# Patient Record
Sex: Female | Born: 2005 | Race: White | Hispanic: No | Marital: Single | Smoking: Never smoker
Health system: Southern US, Community
[De-identification: ages and names within clinical notes are randomized; demographics above are authoritative.]

## PROBLEM LIST (undated history)

## (undated) HISTORY — PX: TONSILLECTOMY: SUR1361

## (undated) HISTORY — PX: ADENOIDECTOMY: SUR15

## (undated) HISTORY — PX: TYMPANOSTOMY TUBE PLACEMENT: SHX32

---
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
## 2023-09-02 ENCOUNTER — Ambulatory Visit (INDEPENDENT_AMBULATORY_CARE_PROVIDER_SITE_OTHER): Payer: Commercial Managed Care - PPO | Admitting: Orthopaedic Surgery

## 2023-09-02 ENCOUNTER — Other Ambulatory Visit (INDEPENDENT_AMBULATORY_CARE_PROVIDER_SITE_OTHER): Payer: Commercial Managed Care - PPO

## 2023-09-02 ENCOUNTER — Encounter: Payer: Self-pay | Admitting: Orthopaedic Surgery

## 2023-09-02 VITALS — BP 120/80 | HR 73 | Ht 64.0 in | Wt 212.0 lb

## 2023-09-02 DIAGNOSIS — M25572 Pain in left ankle and joints of left foot: Secondary | ICD-10-CM | POA: Diagnosis not present

## 2023-09-02 MED ORDER — PREDNISONE 5 MG (21) PO TBPK: ORAL_TABLET | ORAL | 0 refills | Status: DC

## 2023-09-02 NOTE — Progress Notes (Signed)
 Subjective:    Patient ID: Kaylee Warren, female    DOB: 07/17/2005, 18 y.o.   MRN: [id number]  HPI She has had left ankle pain since October.  She dances ballet and has pain that has increased over the last few months.  She has pain when pointing and now when walking short distances. She has no redness, no swelling, no numbness. She has taken 800 mgm ibuprofen three to four times a day for over two weeks with no help. She has seen the PA at the medical clinic at her college in November.  She has been wearing high top tennis shoes and they have not helped.  She is tired of hurting.   Review of Systems  Constitutional:  Positive for activity change.  Musculoskeletal:  Positive for arthralgias and gait problem.  All other systems reviewed and are negative. For Review of Systems, all other systems reviewed and are negative.  The following is a summary of the past history medically, past history surgically, known current medicines, social history and family history.  This information is gathered electronically by the computer from prior information and documentation.  I review this each visit and have found including this information at this point in the chart is beneficial and informative.   No past medical history on file.  Past Surgical History:  Procedure Laterality Date   TONSILLECTOMY     TYMPANOSTOMY TUBE PLACEMENT      Current Outpatient Medications on File Prior to Visit  Medication Sig Dispense Refill   cetirizine-pseudoephedrine (ZYRTEC-D) 5-120 MG tablet Take 1 tablet by mouth daily. 30 tablet 0   fluticasone (FLONASE) 50 MCG/ACT nasal spray Place 1 spray into both nostrils daily. 16 g 0   magic mouthwash w/lidocaine SOLN Take 5 mLs by mouth 3 (three) times daily as needed for mouth pain. Swish and spit, do not swallow 100 mL 0   promethazine-dextromethorphan (PROMETHAZINE-DM) 6.25-15 MG/5ML syrup Take 5 mLs by mouth 4 (four) times daily as needed for cough. 118 mL 0    No current facility-administered medications on file prior to visit.    Social History   Socioeconomic History   Marital status: Single    Spouse name: Not on file   Number of children: Not on file   Years of education: Not on file   Highest education level: Not on file  Occupational History   Not on file  Tobacco Use   Smoking status: Never   Smokeless tobacco: Never  Substance and Sexual Activity   Alcohol use: No   Drug use: No   Sexual activity: Never  Other Topics Concern   Not on file  Social History Narrative   Not on file   Social Drivers of Health   Financial Resource Strain: Not on file  Food Insecurity: Not on file  Transportation Needs: Not on file  Physical Activity: Not on file  Stress: Not on file  Social Connections: Not on file  Intimate Partner Violence: Not on file    No family history on file.  BP 120/80   Pulse 73   Ht 5\' 4"  (1.626 m)   Wt 212 lb (96.2 kg)   BMI 36.39 kg/m   Body mass index is 36.39 kg/m.      Objective:   Physical Exam Vitals and nursing note reviewed. Exam conducted with a chaperone present.  Constitutional:      Appearance: She is well-developed.  HENT:     Head: Normocephalic and atraumatic.  Eyes:     Conjunctiva/sclera: Conjunctivae normal.     Pupils: Pupils are equal, round, and reactive to light.  Cardiovascular:     Rate and Rhythm: Normal rate and regular rhythm.  Pulmonary:     Effort: Pulmonary effort is normal.  Abdominal:     Palpations: Abdomen is soft.  Musculoskeletal:     Cervical back: Normal range of motion and neck supple.       Feet:  Skin:    General: Skin is warm and dry.  Neurological:     Mental Status: She is alert and oriented to person, place, and time.     Cranial Nerves: No cranial nerve deficit.     Motor: No abnormal muscle tone.     Coordination: Coordination normal.     Deep Tendon Reflexes: Reflexes are normal and symmetric. Reflexes normal.  Psychiatric:         Behavior: Behavior normal.        Thought Content: Thought content normal.        Judgment: Judgment normal.   X-rays were done of the left ankle, reported separately.        Assessment & Plan:   Encounter Diagnosis  Name Primary?   Pain in left ankle and joints of left foot Yes   X-rays are negative.  I will begin prednisone dose pack.  Do not take Advil with it.  Resume Advil after finishing dose pack.  She may need MRI.  Return in two weeks.  Call if any problem.  Precautions discussed.  Electronically Signed Darreld Mclean, MD 2/26/20259:04 AM

## 2023-09-16 ENCOUNTER — Encounter: Payer: Self-pay | Admitting: Orthopaedic Surgery

## 2023-09-16 ENCOUNTER — Ambulatory Visit (INDEPENDENT_AMBULATORY_CARE_PROVIDER_SITE_OTHER): Payer: Commercial Managed Care - PPO | Admitting: Orthopaedic Surgery

## 2023-09-16 VITALS — BP 135/78 | HR 80 | Ht 64.0 in | Wt 212.0 lb

## 2023-09-16 DIAGNOSIS — G8929 Other chronic pain: Secondary | ICD-10-CM | POA: Diagnosis not present

## 2023-09-16 DIAGNOSIS — M25572 Pain in left ankle and joints of left foot: Secondary | ICD-10-CM | POA: Diagnosis not present

## 2023-09-16 NOTE — Patient Instructions (Signed)
 While we are working on your approval for MRI please go ahead and call to schedule your appointment with [facility] within at least one (1) week.   Central Scheduling [phone]

## 2023-09-16 NOTE — Progress Notes (Signed)
 My ankle still hurts.  She has persistent pain of the left lateral ankle.  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  She should have a MRI as she has not improved.  Her schedule will not allow it to be done until the first of May.  I will order the MRI and see her May 14.  Encounter Diagnosis  Name Primary?   Chronic pain of left ankle Yes   Left ankle has pain diffusely laterally but no redness or swelling.  ROM is painful.  NV intact.  Return May 14.  Call if any problem.  Precautions discussed.  Electronically Signed Darreld Mclean, MD 3/12/20259:51 AM

## 2023-10-05 ENCOUNTER — Telehealth: Payer: Self-pay | Admitting: Orthopaedic Surgery

## 2023-10-05 NOTE — Telephone Encounter (Signed)
 Dr. Sanjuan Dame pt - spoke w/Stacey, the pt's mom, she is requesting to speak with Amy.  She stated the order for the MRI was sent to [facility] and they are requesting to go to [facility] for the open unit.  [phone]

## 2023-10-05 NOTE — Telephone Encounter (Signed)
 Order has been changed to [facility] imaging

## 2023-11-18 ENCOUNTER — Ambulatory Visit: Admitting: Orthopaedic Surgery

## 2023-11-23 ENCOUNTER — Encounter: Payer: Self-pay | Admitting: Orthopaedic Surgery

## 2023-11-24 ENCOUNTER — Ambulatory Visit
Admission: RE | Admit: 2023-11-24 | Discharge: 2023-11-24 | Disposition: A | Discharge status: Home / Self Care | Source: Ambulatory Visit | Attending: Orthopaedic Surgery | Admitting: Orthopaedic Surgery

## 2023-11-24 DIAGNOSIS — G8929 Other chronic pain: Secondary | ICD-10-CM

## 2023-12-09 ENCOUNTER — Ambulatory Visit: Admitting: Orthopaedic Surgery

## 2023-12-09 ENCOUNTER — Encounter: Payer: Self-pay | Admitting: Orthopaedic Surgery

## 2023-12-09 VITALS — Ht 64.0 in | Wt 212.0 lb

## 2023-12-09 DIAGNOSIS — M25572 Pain in left ankle and joints of left foot: Secondary | ICD-10-CM

## 2023-12-09 DIAGNOSIS — G8929 Other chronic pain: Secondary | ICD-10-CM

## 2023-12-09 NOTE — Progress Notes (Signed)
 Her MRI of the ankle has been done but not read.  We had called for the report yesterday and again today.  We were told it would be ready by 10am.  It is now after 11 am and no report.  She wants to return in one week.  I offered the [facility] office tomorrow but she would rather wait.  Encounter Diagnosis  Name Primary?   Chronic pain of left ankle Yes   No treatment, no charge.  Call if any problem.  Precautions discussed.  Electronically Signed Pleasant Brilliant, MD 6/4/202511:11 AM

## 2023-12-16 ENCOUNTER — Ambulatory Visit (INDEPENDENT_AMBULATORY_CARE_PROVIDER_SITE_OTHER): Admitting: Orthopaedic Surgery

## 2023-12-16 ENCOUNTER — Encounter: Payer: Self-pay | Admitting: Orthopaedic Surgery

## 2023-12-16 VITALS — Ht 64.0 in | Wt 212.0 lb

## 2023-12-16 DIAGNOSIS — M25572 Pain in left ankle and joints of left foot: Secondary | ICD-10-CM | POA: Diagnosis not present

## 2023-12-16 DIAGNOSIS — G8929 Other chronic pain: Secondary | ICD-10-CM | POA: Diagnosis not present

## 2023-12-16 NOTE — Progress Notes (Signed)
 My left ankle still hurts.  She had MRI of the left ankle showing: IMPRESSION: 1. Findings most compatible with partial-thickness split tear of the peroneal brevis tendon. 2. Mild tenosynovitis of the posterior tibialis tendon just proximal to the navicular insertion. 3. Sequela of prior strain or chronic tensile changes of the dorsal talonavicular ligament with mild dorsal talar spurring.  I have explained the findings to her.  I will have Dr. Julio Ohm see her.  I have independently reviewed the MRI.    Left ankle is tender laterally and also over the posterior tibialis tendon distally.  NV intact. She has a slight limp.   Encounter Diagnoses  Name Primary?   Chronic pain of left ankle Yes   Pain in left ankle and joints of left foot    To see Dr. Julio Ohm.  Call if any problem.  Precautions discussed.  Electronically Signed Pleasant Brilliant, MD 6/11/20259:08 AM

## 2023-12-16 NOTE — Patient Instructions (Signed)
 To see Dr. Julio Ohm.  Make appointment.

## 2023-12-29 ENCOUNTER — Ambulatory Visit (INDEPENDENT_AMBULATORY_CARE_PROVIDER_SITE_OTHER): Admitting: Orthopedic Surgery

## 2023-12-29 DIAGNOSIS — M6702 Short Achilles tendon (acquired), left ankle: Secondary | ICD-10-CM

## 2023-12-29 DIAGNOSIS — M25572 Pain in left ankle and joints of left foot: Secondary | ICD-10-CM

## 2024-01-04 ENCOUNTER — Encounter: Payer: Self-pay | Admitting: Orthopedic Surgery

## 2024-01-04 NOTE — Progress Notes (Signed)
 Office Visit Note   Patient: Kaylee Warren           Date of Birth: 04-08-2006           MRN: [id number] Visit Date: 12/29/2023              Requested by: Brenna Lin, MD [address] [place],  [place] [address] PCP: Bertell Satterfield, MD  Chief Complaint  Patient presents with   Left Ankle - Pain      HPI: Patient is a 18 year old woman who is seen for initial evaluation for left foot and ankle pain.  Referred by Dr. Brenna.  Patient is status post MRI scan.  Patient states that she does take ballet classes at school.  She has tried prednisone  Dosepak without relief.  Patient states that her pain is primarily over the 4th and 5th metatarsal heads.  Assessment & Plan: Visit Diagnoses:  1. Pain in left ankle and joints of left foot   2. Contracture of left Achilles tendon     Plan: Patient was given instructions and demonstrated Achilles stretching.  Recommended orthotics to unload the forefoot.  Follow-Up Instructions: Return if symptoms worsen or fail to improve.   Ortho Exam  Patient is alert, oriented, no adenopathy, well-dressed, normal affect, normal respiratory effort. Examination patient has a good dorsalis pedis pulse.  She has no tenderness to palpation over the peroneal tendons or posterior tibial tendon.  There is no swelling over the peroneal tendons.  No pain with resisted eversion.  Patient has callus beneath the 4th and 5th metatarsal heads.  Patient does have Achilles contracture with dorsiflexion 20 degrees short of neutral.  She is overloading the forefoot secondary to the Achilles contracture.  Review of the MRI scan shows edema linearly in the peroneal tendons where it is is most commonly seen.    Imaging: No results found. No images are attached to the encounter.  Labs: Lab Results  Component Value Date   REPTSTATUS 06/30/2016 FINAL 06/27/2016   CULT  06/27/2016    NO GROUP A STREP (S.PYOGENES) ISOLATED Performed at [facility]      No results found for: ALBUMIN, PREALBUMIN, CBC  No results found for: MG No results found for: VD25OH  No results found for: PREALBUMIN    Latest Ref Rng & Units 10/27/2010    6:51 PM  CBC EXTENDED  WBC 4.5 - 13.5 K/uL 9.3   RBC 3.80 - 5.20 MIL/uL 4.92   Hemoglobin 11.0 - 14.6 g/dL 86.1   HCT 66.9 - 55.9 % 39.9   Platelets 150 - 400 K/uL 335   NEUT# 1.5 - 8.0 K/uL 5.8   Lymph# 1.5 - 7.5 K/uL 2.5      There is no height or weight on file to calculate BMI.  Orders:  No orders of the defined types were placed in this encounter.  No orders of the defined types were placed in this encounter.    Procedures: No procedures performed  Clinical Data: No additional findings.  ROS:  All other systems negative, except as noted in the HPI. Review of Systems  Objective: Vital Signs: There were no vitals taken for this visit.  Specialty Comments:  No specialty comments available.  PMFS History: Patient Active Problem List   Diagnosis Date Noted   Abdominal pain 05/19/2012   Constipation 05/19/2012   History reviewed. No pertinent past medical history.  History reviewed. No pertinent family history.  Past Surgical History:  Procedure  Laterality Date   TONSILLECTOMY     TYMPANOSTOMY TUBE PLACEMENT     Social History   Occupational History   Not on file  Tobacco Use   Smoking status: Never   Smokeless tobacco: Never  Substance and Sexual Activity   Alcohol use: No   Drug use: No   Sexual activity: Never

## 2024-02-26 ENCOUNTER — Encounter: Payer: Self-pay | Admitting: Radiology

## 2024-04-09 ENCOUNTER — Other Ambulatory Visit: Payer: Self-pay

## 2024-04-09 ENCOUNTER — Encounter (HOSPITAL_COMMUNITY): Payer: Self-pay | Admitting: Emergency Medicine

## 2024-04-09 ENCOUNTER — Ambulatory Visit (INDEPENDENT_AMBULATORY_CARE_PROVIDER_SITE_OTHER)

## 2024-04-09 ENCOUNTER — Ambulatory Visit (HOSPITAL_COMMUNITY)
Admission: EM | Admit: 2024-04-09 | Discharge: 2024-04-09 | Disposition: A | Discharge status: Home / Self Care | Attending: Family Medicine | Admitting: Family Medicine

## 2024-04-09 DIAGNOSIS — M79671 Pain in right foot: Secondary | ICD-10-CM | POA: Diagnosis not present

## 2024-04-09 NOTE — ED Triage Notes (Signed)
 Patient was wearing jazz shoes today, dancing and perhaps landed wrong and noted pain in right foot. Pain across top of foot at base of toes, and toe next to great toe is painful and swollen.  Patient too 4 ibuprofen

## 2024-04-13 NOTE — ED Provider Notes (Signed)
 [facility] CARE CENTER   [id number] 04/09/24 Arrival Time: 1647  ASSESSMENT & PLAN:  1. Right foot pain    I have personally viewed and independently interpreted the imaging studies ordered this visit. R foot: no fx appreciated.  Orders Placed This Encounter  Procedures   DG Foot Complete Right   Apply CAM boot   Work/school excuse note: not needed. Recommend:  Follow-up Information     Schedule an appointment as soon as possible for a visit  with [facility]).   Contact information: [address] [place],  [place]  [address]  [phone]                Prefers OTC analgesics.   Reviewed expectations re: course of current medical issues. Questions answered. Outlined signs and symptoms indicating need for more acute intervention. Patient verbalized understanding. After Visit Summary given.  SUBJECTIVE: History from: patient. Kaylee Warren is a 18 y.o. female who reports that she was wearing jazz shoes today, dancing and perhaps landed wrong and noted pain in right foot. Pain across top of foot at base of toes, and toe next to great toe is painful and swollen.  Patient too 4 ibuprofen  Denies extremity sensation changes or weakness.   Past Surgical History:  Procedure Laterality Date   TONSILLECTOMY     TYMPANOSTOMY TUBE PLACEMENT        OBJECTIVE:  Vitals:   04/09/24 1744  BP: 119/81  Pulse: 79  Resp: 16  Temp: 98 F (36.7 C)  TempSrc: Oral  SpO2: 98%    General appearance: alert; no distress HEENT: [place]; AT Neck: supple with FROM Resp: unlabored respirations Extremities: RLE: warm with well perfused appearance; poorly localized moderate tenderness over right prox/dorsal foot; without gross deformities; swelling: minimal; bruising: none; ankle/toes ROM: normal CV: brisk extremity capillary refill of RLE; 2+ DP pulse of RLE. Skin: warm and dry; no visible rashes Neurologic: normal sensation and strength of  RLE Psychological: alert and cooperative; normal mood and affect  Imaging: DG Foot Complete Right Result Date: 04/09/2024 CLINICAL DATA:  landed awkwardly while dancing. right foot, toe next to great toe is hurting EXAM: RIGHT FOOT COMPLETE - 3+ VIEW COMPARISON:  None Available. FINDINGS: No acute fracture or dislocation. There is no evidence of arthropathy or other focal bone abnormality. Soft tissues are unremarkable. No radiopaque foreign body. IMPRESSION: No acute fracture or dislocation. Electronically Signed   By: Rogelia Myers M.D.   On: 04/09/2024 18:42       No Known Allergies  History reviewed. No pertinent past medical history. Social History   Socioeconomic History   Marital status: Single    Spouse name: Not on file   Number of children: Not on file   Years of education: Not on file   Highest education level: Not on file  Occupational History   Not on file  Tobacco Use   Smoking status: Never   Smokeless tobacco: Never  Vaping Use   Vaping status: Never Used  Substance and Sexual Activity   Alcohol use: No   Drug use: No   Sexual activity: Never    Birth control/protection: None  Other Topics Concern   Not on file  Social History Narrative   Not on file   Social Drivers of Health   Financial Resource Strain: Not on file  Food Insecurity: Not on file  Transportation Needs: Not on file  Physical Activity: Not on file  Stress: Not on  file  Social Connections: Not on file   History reviewed. No pertinent family history. Past Surgical History:  Procedure Laterality Date   TONSILLECTOMY     TYMPANOSTOMY TUBE PLACEMENT         Rolinda Rogue, MD 04/13/24 [phone]

## 2024-04-28 ENCOUNTER — Ambulatory Visit (INDEPENDENT_AMBULATORY_CARE_PROVIDER_SITE_OTHER): Admitting: Orthopedic Surgery

## 2024-04-28 DIAGNOSIS — M79671 Pain in right foot: Secondary | ICD-10-CM | POA: Diagnosis not present

## 2024-05-02 ENCOUNTER — Encounter: Payer: Self-pay | Admitting: Orthopedic Surgery

## 2024-05-02 NOTE — Progress Notes (Signed)
 Office Visit Note   Patient: Kaylee Warren           Date of Birth: Jun 20, 2006           MRN: [id number] Visit Date: 04/28/2024              Requested by: Bertell Satterfield, MD [address] [place],  [place] [address] PCP: Bertell Satterfield, MD  Chief Complaint  Patient presents with   Right Foot - Pain      HPI: Discussed the use of AI scribe software for clinical note transcription with the patient, who gave verbal consent to proceed.  History of Present Illness Kaylee Warren is a 18 year old female who presents with right foot pain and numbness.  She experiences pain and numbness in her right foot, with numbness primarily on the medial side. The pain is more pronounced on the side of the foot rather than under the ball. She notes stiffness in the joint but no angular deformity.  There is no history of specific injury. She has been using a stiff shoe to manage the symptoms. No pain under the ball of the foot.     Assessment & Plan: Visit Diagnoses:  1. Pain in right foot     Plan: Assessment and Plan Assessment & Plan Right second toe PIP joint strain with decreased motion Radiographs show swelling without deformity, subluxation, dislocation, or fracture. Joint is stiff but aligned, indicating strain. Expected improvement in six to eight weeks. - Continue using a stiff shoe to reduce pressure. - Allow six to eight weeks for healing. - Return for follow-up and repeat radiographs if symptoms persist after eight weeks.  Numbness of medial right foot Numbness may be related to altered gait from toe injury. - Monitor numbness and reevaluate as needed.      Follow-Up Instructions: Return if symptoms worsen or fail to improve.   Ortho Exam  Patient is alert, oriented, no adenopathy, well-dressed, normal affect, normal respiratory effort. Physical Exam MUSCULOSKELETAL: Swelling of the PIP joint of the right second toe. Full range of motion  of the right great toe, no pain with distraction across the Lisfranc complex. Decreased motion of the PIP joint of the right second toe, no angular deformity. Joint congruent in all views, no fracture. SKIN: No ecchymosis, bruising, or swelling.      Imaging: No results found. No images are attached to the encounter.  Labs: Lab Results  Component Value Date   REPTSTATUS 06/30/2016 FINAL 06/27/2016   CULT  06/27/2016    NO GROUP A STREP (S.PYOGENES) ISOLATED Performed at [facility]      No results found for: ALBUMIN, PREALBUMIN, CBC  No results found for: MG No results found for: VD25OH  No results found for: PREALBUMIN    Latest Ref Rng & Units 10/27/2010    6:51 PM  CBC EXTENDED  WBC 4.5 - 13.5 K/uL 9.3   RBC 3.80 - 5.20 MIL/uL 4.92   Hemoglobin 11.0 - 14.6 g/dL 86.1   HCT 66.9 - 55.9 % 39.9   Platelets 150 - 400 K/uL 335   NEUT# 1.5 - 8.0 K/uL 5.8   Lymph# 1.5 - 7.5 K/uL 2.5      There is no height or weight on file to calculate BMI.  Orders:  No orders of the defined types were placed in this encounter.  No orders of the defined types were placed in this encounter.    Procedures: No procedures  performed  Clinical Data: No additional findings.  ROS:  All other systems negative, except as noted in the HPI. Review of Systems  Objective: Vital Signs: LMP 02/08/2024 (Approximate)   Specialty Comments:  No specialty comments available.  PMFS History: Patient Active Problem List   Diagnosis Date Noted   Abdominal pain 05/19/2012   Constipation 05/19/2012   History reviewed. No pertinent past medical history.  History reviewed. No pertinent family history.  Past Surgical History:  Procedure Laterality Date   TONSILLECTOMY     TYMPANOSTOMY TUBE PLACEMENT     Social History   Occupational History   Not on file  Tobacco Use   Smoking status: Never   Smokeless tobacco: Never  Vaping Use   Vaping status: Never Used   Substance and Sexual Activity   Alcohol use: No   Drug use: No   Sexual activity: Never    Birth control/protection: None

## 2024-05-05 ENCOUNTER — Ambulatory Visit: Admitting: Orthopedic Surgery

## 2024-05-09 ENCOUNTER — Encounter: Payer: Self-pay | Admitting: Radiology

## 2024-05-11 ENCOUNTER — Emergency Department (HOSPITAL_COMMUNITY)
Admission: EM | Admit: 2024-05-11 | Discharge: 2024-05-11 | Disposition: A | Discharge status: Home / Self Care | Attending: Emergency Medicine | Admitting: Emergency Medicine

## 2024-05-11 ENCOUNTER — Encounter (HOSPITAL_COMMUNITY): Payer: Self-pay

## 2024-05-11 ENCOUNTER — Other Ambulatory Visit: Payer: Self-pay

## 2024-05-11 DIAGNOSIS — Y92410 Unspecified street and highway as the place of occurrence of the external cause: Secondary | ICD-10-CM | POA: Diagnosis not present

## 2024-05-11 DIAGNOSIS — S060X0A Concussion without loss of consciousness, initial encounter: Secondary | ICD-10-CM | POA: Insufficient documentation

## 2024-05-11 DIAGNOSIS — S7001XA Contusion of right hip, initial encounter: Secondary | ICD-10-CM | POA: Diagnosis not present

## 2024-05-11 DIAGNOSIS — S0990XA Unspecified injury of head, initial encounter: Secondary | ICD-10-CM | POA: Diagnosis present

## 2024-05-11 LAB — POC URINE PREG, ED: Preg Test, Ur: NEGATIVE
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
## 2024-05-11 NOTE — Discharge Instructions (Signed)
 Avoid heavy lifting bending or strenuous activities for at least 1 week.  You may take Tylenol  or ibuprofen as directed if needed for headache.  I have listed 2 of the primary care clinics in the area that you may contact to establish primary care if needed.  As discussed, please return to the emergency department if you develop any new or worsening symptoms

## 2024-05-11 NOTE — ED Notes (Signed)
 Pt ambulated to restroom w/o assistance. Gait steady, no imbalances observed.
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
## 2024-05-31 ENCOUNTER — Encounter (HOSPITAL_BASED_OUTPATIENT_CLINIC_OR_DEPARTMENT_OTHER): Payer: Self-pay | Admitting: Emergency Medicine

## 2024-05-31 ENCOUNTER — Emergency Department (HOSPITAL_BASED_OUTPATIENT_CLINIC_OR_DEPARTMENT_OTHER)

## 2024-05-31 ENCOUNTER — Emergency Department (HOSPITAL_BASED_OUTPATIENT_CLINIC_OR_DEPARTMENT_OTHER)
Admission: EM | Admit: 2024-05-31 | Discharge: 2024-05-31 | Disposition: A | Discharge status: Home / Self Care | Attending: Emergency Medicine | Admitting: Emergency Medicine

## 2024-05-31 ENCOUNTER — Other Ambulatory Visit: Payer: Self-pay

## 2024-05-31 DIAGNOSIS — G43909 Migraine, unspecified, not intractable, without status migrainosus: Secondary | ICD-10-CM | POA: Diagnosis not present

## 2024-05-31 DIAGNOSIS — R1110 Vomiting, unspecified: Secondary | ICD-10-CM | POA: Diagnosis present

## 2024-05-31 LAB — COMPREHENSIVE METABOLIC PANEL WITH GFR
ALT: 13 U/L (ref 0–44)
AST: 21 U/L (ref 15–41)
Albumin: 4.2 g/dL (ref 3.5–5.0)
Alkaline Phosphatase: 79 U/L (ref 38–126)
Anion gap: 12 (ref 5–15)
BUN: 12 mg/dL (ref 6–20)
CO2: 25 mmol/L (ref 22–32)
Calcium: 9.6 mg/dL (ref 8.9–10.3)
Chloride: 101 mmol/L (ref 98–111)
Creatinine, Ser: 0.65 mg/dL (ref 0.44–1.00)
GFR, Estimated: >60 mL/min (ref >60)
Glucose, Bld: 90 mg/dL (ref 70–99)
Potassium: 3.9 mmol/L (ref 3.5–5.1)
Sodium: 138 mmol/L (ref 135–145)
Total Bilirubin: 0.5 mg/dL (ref 0.0–1.2)
Total Protein: 7.7 g/dL (ref 6.5–8.1)

## 2024-05-31 LAB — CBC WITH DIFFERENTIAL/PLATELET
Abs Immature Granulocytes: 0.01 K/uL (ref 0.00–0.07)
Basophils Absolute: 0 K/uL (ref 0.0–0.1)
Basophils Relative: 1 %
Eosinophils Absolute: 0.1 K/uL (ref 0.0–0.5)
Eosinophils Relative: 2 %
HCT: 40.8 % (ref 36.0–46.0)
Hemoglobin: 13.7 g/dL (ref 12.0–15.0)
Immature Granulocytes: 0 %
Lymphocytes Relative: 35 %
Lymphs Abs: 2.2 K/uL (ref 0.7–4.0)
MCH: 28.5 pg (ref 26.0–34.0)
MCHC: 33.6 g/dL (ref 30.0–36.0)
MCV: 85 fL (ref 80.0–100.0)
Monocytes Absolute: 0.6 K/uL (ref 0.1–1.0)
Monocytes Relative: 10 %
Neutro Abs: 3.3 K/uL (ref 1.7–7.7)
Neutrophils Relative %: 52 %
Platelets: 281 K/uL (ref 150–400)
RBC: 4.8 MIL/uL (ref 3.87–5.11)
RDW: 13.2 % (ref 11.5–15.5)
WBC: 6.3 K/uL (ref 4.0–10.5)
nRBC: 0 % (ref 0.0–0.2)

## 2024-05-31 LAB — URINALYSIS, ROUTINE W REFLEX MICROSCOPIC
Bilirubin Urine: NEGATIVE
Glucose, UA: NEGATIVE mg/dL
Hgb urine dipstick: NEGATIVE
Leukocytes,Ua: NEGATIVE
Nitrite: NEGATIVE
Specific Gravity, Urine: 1.026 (ref 1.005–1.030)
pH: 5.5 (ref 5.0–8.0)

## 2024-05-31 LAB — HCG, SERUM, QUALITATIVE: Preg, Serum: NEGATIVE

## 2024-05-31 LAB — LIPASE, BLOOD: Lipase: 16 U/L (ref 11–51)

## 2024-05-31 MED ORDER — DIPHENHYDRAMINE HCL 25 MG PO CAPS
25.0000 mg | ORAL_CAPSULE | Freq: Once | ORAL | Status: AC
  Administered 2024-05-31: 25 mg via ORAL
  Filled 2024-05-31: qty 1

## 2024-05-31 MED ORDER — PROCHLORPERAZINE MALEATE 10 MG PO TABS
10.0000 mg | ORAL_TABLET | Freq: Once | ORAL | Status: AC
  Administered 2024-05-31: 10 mg via ORAL
  Filled 2024-05-31: qty 1

## 2024-05-31 MED ORDER — ONDANSETRON 4 MG PO TBDP: 4.0000 mg | ORAL_TABLET | Freq: Three times a day (TID) | ORAL | 0 refills | Status: DC | PRN

## 2024-05-31 MED ORDER — IBUPROFEN 800 MG PO TABS
800.0000 mg | ORAL_TABLET | Freq: Once | ORAL | Status: AC
  Administered 2024-05-31: 800 mg via ORAL
  Filled 2024-05-31: qty 1

## 2024-05-31 MED ORDER — KETOROLAC TROMETHAMINE 15 MG/ML IJ SOLN: 15.0000 mg | Freq: Once | INTRAMUSCULAR | Status: DC
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
## 2024-07-15 ENCOUNTER — Encounter: Payer: Self-pay | Admitting: Family Medicine

## 2024-07-15 ENCOUNTER — Ambulatory Visit (INDEPENDENT_AMBULATORY_CARE_PROVIDER_SITE_OTHER): Admitting: Family Medicine

## 2024-07-15 ENCOUNTER — Telehealth: Payer: Self-pay | Admitting: Family Medicine

## 2024-07-15 VITALS — BP 106/55 | HR 78 | Temp 98.0°F | Ht 64.0 in | Wt 198.0 lb

## 2024-07-15 DIAGNOSIS — F419 Anxiety disorder, unspecified: Secondary | ICD-10-CM | POA: Diagnosis not present

## 2024-07-15 NOTE — Progress Notes (Deleted)
 SABRA
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
## 2024-07-15 NOTE — Telephone Encounter (Signed)
 Record Request Form has been signed and faxed to [facility] at [phone]. Patients mom provided medical records fax # and also gave email address to use as back up in case fax doesn't go through. Email is [email or web address]
